# Patient Record
Sex: Female | Born: 1995 | Race: Black or African American | Hispanic: No | Marital: Single | Smoking: Never smoker
Health system: Southern US, Community
[De-identification: ages and names within clinical notes are randomized; demographics above are authoritative.]

## PROBLEM LIST (undated history)

## (undated) DIAGNOSIS — D649 Anemia, unspecified: Secondary | ICD-10-CM

---
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
## 2018-08-16 ENCOUNTER — Emergency Department (HOSPITAL_BASED_OUTPATIENT_CLINIC_OR_DEPARTMENT_OTHER): Payer: BLUE CROSS/BLUE SHIELD

## 2018-08-16 ENCOUNTER — Emergency Department (HOSPITAL_BASED_OUTPATIENT_CLINIC_OR_DEPARTMENT_OTHER)
Admission: EM | Admit: 2018-08-16 | Discharge: 2018-08-16 | Disposition: A | Discharge status: Home / Self Care | Payer: BLUE CROSS/BLUE SHIELD | Attending: Emergency Medicine | Admitting: Emergency Medicine

## 2018-08-16 ENCOUNTER — Other Ambulatory Visit: Payer: Self-pay

## 2018-08-16 ENCOUNTER — Encounter (HOSPITAL_BASED_OUTPATIENT_CLINIC_OR_DEPARTMENT_OTHER): Payer: Self-pay | Admitting: Emergency Medicine

## 2018-08-16 DIAGNOSIS — Y92410 Unspecified street and highway as the place of occurrence of the external cause: Secondary | ICD-10-CM | POA: Diagnosis not present

## 2018-08-16 DIAGNOSIS — Y939 Activity, unspecified: Secondary | ICD-10-CM | POA: Diagnosis not present

## 2018-08-16 DIAGNOSIS — Y998 Other external cause status: Secondary | ICD-10-CM | POA: Diagnosis not present

## 2018-08-16 DIAGNOSIS — Z79899 Other long term (current) drug therapy: Secondary | ICD-10-CM | POA: Diagnosis not present

## 2018-08-16 DIAGNOSIS — M545 Low back pain, unspecified: Secondary | ICD-10-CM

## 2018-08-16 MED ORDER — METHOCARBAMOL 500 MG PO TABS
500.0000 mg | ORAL_TABLET | Freq: Once | ORAL | Status: AC
  Administered 2018-08-16: 500 mg via ORAL
  Filled 2018-08-16: qty 1

## 2018-08-16 MED ORDER — NAPROXEN 500 MG PO TABS: 500.0000 mg | ORAL_TABLET | Freq: Two times a day (BID) | ORAL | 0 refills | Status: AC
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
## 2018-08-16 NOTE — Discharge Instructions (Addendum)
I have prescribed medication for your pain, please take 1 tablet twice a day as needed.You may also apply heat to the area of pain. Follow up with your primary care as needed.  °
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
## 2023-10-01 ENCOUNTER — Other Ambulatory Visit: Payer: Self-pay

## 2023-10-01 ENCOUNTER — Emergency Department (HOSPITAL_BASED_OUTPATIENT_CLINIC_OR_DEPARTMENT_OTHER)
Admission: EM | Admit: 2023-10-01 | Discharge: 2023-10-01 | Disposition: A | Discharge status: Home / Self Care | Payer: No Typology Code available for payment source | Attending: Emergency Medicine | Admitting: Emergency Medicine

## 2023-10-01 ENCOUNTER — Encounter (HOSPITAL_BASED_OUTPATIENT_CLINIC_OR_DEPARTMENT_OTHER): Payer: Self-pay

## 2023-10-01 DIAGNOSIS — H6692 Otitis media, unspecified, left ear: Secondary | ICD-10-CM | POA: Insufficient documentation

## 2023-10-01 DIAGNOSIS — Z20822 Contact with and (suspected) exposure to covid-19: Secondary | ICD-10-CM | POA: Insufficient documentation

## 2023-10-01 DIAGNOSIS — J069 Acute upper respiratory infection, unspecified: Secondary | ICD-10-CM | POA: Diagnosis not present

## 2023-10-01 DIAGNOSIS — H6690 Otitis media, unspecified, unspecified ear: Secondary | ICD-10-CM

## 2023-10-01 DIAGNOSIS — J029 Acute pharyngitis, unspecified: Secondary | ICD-10-CM | POA: Diagnosis present

## 2023-10-01 HISTORY — DX: Anemia, unspecified: D64.9

## 2023-10-01 LAB — GROUP A STREP BY PCR: Group A Strep by PCR: NOT DETECTED

## 2023-10-01 LAB — RESP PANEL BY RT-PCR (RSV, FLU A&B, COVID)  RVPGX2
Influenza A by PCR: NEGATIVE
Influenza B by PCR: NEGATIVE
Resp Syncytial Virus by PCR: NEGATIVE
SARS Coronavirus 2 by RT PCR: NEGATIVE

## 2023-10-01 MED ORDER — DOXYCYCLINE HYCLATE 100 MG PO CAPS: 100.0000 mg | ORAL_CAPSULE | Freq: Two times a day (BID) | ORAL | 0 refills | Status: AC

## 2023-10-01 MED ORDER — HYDROCOD POLI-CHLORPHE POLI ER 10-8 MG/5ML PO SUER: 5.0000 mL | Freq: Two times a day (BID) | ORAL | 0 refills | Status: AC | PRN

## 2023-10-01 MED ORDER — KETOROLAC TROMETHAMINE 30 MG/ML IJ SOLN
30.0000 mg | Freq: Once | INTRAMUSCULAR | Status: AC
  Administered 2023-10-01: 30 mg via INTRAMUSCULAR
  Filled 2023-10-01: qty 1

## 2023-10-01 MED ORDER — IBUPROFEN 600 MG PO TABS: 600.0000 mg | ORAL_TABLET | Freq: Four times a day (QID) | ORAL | 0 refills | Status: AC | PRN

## 2023-10-01 MED ORDER — DOXYCYCLINE HYCLATE 100 MG PO TABS
100.0000 mg | ORAL_TABLET | Freq: Once | ORAL | Status: AC
  Administered 2023-10-01: 100 mg via ORAL
  Filled 2023-10-01: qty 1

## 2023-10-01 MED ORDER — OXYMETAZOLINE HCL 0.05 % NA SOLN
1.0000 | Freq: Once | NASAL | Status: AC
  Administered 2023-10-01: 1 via NASAL
  Filled 2023-10-01: qty 30

## 2023-10-01 NOTE — ED Triage Notes (Signed)
 Pt c/o sore throat and pressure in her left ear for the past few days. Went to UC the other day and tested negative for flu and strep.

## 2023-10-01 NOTE — ED Notes (Signed)

## 2023-10-01 NOTE — ED Provider Notes (Signed)
 [place] [facility]GENCY DEPARTMENT AT [facility] Provider Note   CSN: [id number] Arrival date & time: 10/01/23  1609     History  Chief Complaint  Patient presents with   Sore Throat    Debbie Middleton is a 28 y.o. female.  Pt is a 28 yo female with pmhx significant for anemia.  Pt has had sore throat and uri sx since 12/29.  She did go to UC on the 31st and was tested for flu and strep.  She was given a rx for amox, but she's allergic to pcn, so she could not take it.  Today, her left ear started to get very painful.  No fevers.       Home Medications Prior to Admission medications   Medication Sig Start Date End Date Taking? Authorizing Provider  chlorpheniramine-HYDROcodone (TUSSIONEX) 10-8 MG/5ML Take 5 mLs by mouth every 12 (twelve) hours as needed for cough. 10/01/23  Yes Dean Clarity, MD  doxycycline  (VIBRAMYCIN ) 100 MG capsule Take 1 capsule (100 mg total) by mouth 2 (two) times daily. 10/01/23  Yes Dean Clarity, MD  ibuprofen  (ADVIL ) 600 MG tablet Take 1 tablet (600 mg total) by mouth every 6 (six) hours as needed. 10/01/23  Yes Dean Clarity, MD  ferrous sulfate 325 (65 FE) MG tablet Take by mouth. 06/23/18 09/21/18  [provider]  Triamcinolone  Acetonide (TRIAMCINOLONE  0.1 % CREAM : EUCERIN) CREA Apply 1 application topically 2 (two) times daily as needed. 01/06/12   Moreno-Coll, Adlih, MD  triamcinolone  ointment (KENALOG) 0.1 % Apply a thin layer to affected area 2-3x per day during flares. 08/04/18   [provider]      Allergies    Macadamia nut oil and Penicillins    Review of Systems   Review of Systems  HENT:  Positive for congestion and ear pain.   All other systems reviewed and are negative.   Physical Exam Updated Vital Signs BP 136/87 (BP Location: Left Arm)   Pulse (!) 111   Temp 99.7 F (37.6 C)   Resp 18   SpO2 99%  Physical Exam Vitals and nursing note reviewed.  Constitutional:      Appearance: She is  well-developed.  HENT:     Head: Normocephalic and atraumatic.     Right Ear: Tympanic membrane normal.     Left Ear: Tympanic membrane is erythematous.     Nose: Rhinorrhea present.  Eyes:     Conjunctiva/sclera: Conjunctivae normal.     Pupils: Pupils are equal, round, and reactive to light.  Cardiovascular:     Rate and Rhythm: Normal rate and regular rhythm.     Heart sounds: Normal heart sounds.  Pulmonary:     Effort: Pulmonary effort is normal.     Breath sounds: Normal breath sounds.  Abdominal:     General: Bowel sounds are normal.     Palpations: Abdomen is soft.  Musculoskeletal:     Cervical back: Normal range of motion and neck supple.  Skin:    General: Skin is warm and dry.     Capillary Refill: Capillary refill takes less than 2 seconds.  Neurological:     General: No focal deficit present.     Mental Status: She is alert and oriented to person, place, and time.  Psychiatric:        Mood and Affect: Mood normal.        Behavior: Behavior normal.     ED Results / Procedures / Treatments  Labs (all labs ordered are listed, but only abnormal results are displayed) Labs Reviewed  RESP PANEL BY RT-PCR (RSV, FLU A&B, COVID)  [id number]  GROUP A STREP BY PCR    EKG None  Radiology No results found.  Procedures Procedures    Medications Ordered in ED Medications  doxycycline  (VIBRA -TABS) tablet 100 mg (has no administration in time range)  ketorolac  (TORADOL ) 30 MG/ML injection 30 mg (30 mg Intramuscular Given 10/01/23 1718)  oxymetazoline  (AFRIN) 0.05 % nasal spray 1 spray (1 spray Each Nare Given 10/01/23 1718)    ED Course/ Medical Decision Making/ A&P                                 Medical Decision Making Risk OTC drugs. Prescription drug management.   This patient presents to the ED for concern of uri sx, this involves an extensive number of treatment options, and is a complaint that carries with it a high risk of complications and morbidity.   The differential diagnosis includes covid/flu/rsv, otitis, other uri   Co morbidities that complicate the patient evaluation  anemia   Additional history obtained:  Additional history obtained from epic chart review  Lab Tests:  I Ordered, and personally interpreted labs.  The pertinent results include:  strep neg, covid/flu/rsv neg  Medicines ordered and prescription drug management:  I ordered medication including toradol /afrin  for sx  Reevaluation of the patient after these medicines showed that the patient improved I have reviewed the patients home medicines and have made adjustments as needed  Problem List / ED Course:  URI sx:  pt given afrin in ed and d/c with tussionex L OM:  pt d/c with doxy and is given 1st dose in ED.  Return if worse.   Reevaluation:  After the interventions noted above, I reevaluated the patient and found that they have :improved   Social Determinants of Health:  Lives at home   Dispostion:  After consideration of the diagnostic results and the patients response to treatment, I feel that the patent would benefit from discharge with outpatient f/u.          Final Clinical Impression(s) / ED Diagnoses Final diagnoses:  Acute otitis media, unspecified otitis media type  Viral upper respiratory tract infection    Rx / DC Orders ED Discharge Orders          Ordered    chlorpheniramine-HYDROcodone (TUSSIONEX) 10-8 MG/5ML  Every 12 hours PRN        10/01/23 1743    doxycycline  (VIBRAMYCIN ) 100 MG capsule  2 times daily        10/01/23 1743    ibuprofen  (ADVIL ) 600 MG tablet  Every 6 hours PRN        10/01/23 1743              Konstantina Nachreiner, MD 10/01/23 1745
